# Patient Record
Sex: Female | Born: 2000 | Race: White | Hispanic: No | Marital: Single | Smoking: Never smoker
Health system: Southern US, Community
[De-identification: ages and names within clinical notes are randomized; demographics above are authoritative.]

## PROBLEM LIST (undated history)

## (undated) DIAGNOSIS — S060X0A Concussion without loss of consciousness, initial encounter: Secondary | ICD-10-CM

## (undated) DIAGNOSIS — T7840XA Allergy, unspecified, initial encounter: Secondary | ICD-10-CM

## (undated) HISTORY — DX: Concussion without loss of consciousness, initial encounter: S06.0X0A

## (undated) HISTORY — DX: Allergy, unspecified, initial encounter: T78.40XA

---
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
## 2018-04-06 DIAGNOSIS — Z23 Encounter for immunization: Secondary | ICD-10-CM | POA: Diagnosis not present

## 2018-04-21 DIAGNOSIS — D224 Melanocytic nevi of scalp and neck: Secondary | ICD-10-CM | POA: Diagnosis not present

## 2018-04-21 DIAGNOSIS — L700 Acne vulgaris: Secondary | ICD-10-CM | POA: Diagnosis not present

## 2018-08-25 ENCOUNTER — Other Ambulatory Visit: Payer: Self-pay | Admitting: Specialist

## 2018-08-25 DIAGNOSIS — G4452 New daily persistent headache (NDPH): Secondary | ICD-10-CM

## 2018-09-01 ENCOUNTER — Ambulatory Visit
Admission: RE | Admit: 2018-09-01 | Discharge: 2018-09-01 | Disposition: A | Discharge status: Home / Self Care | Payer: 59 | Source: Ambulatory Visit | Attending: Specialist | Admitting: Specialist

## 2018-09-01 ENCOUNTER — Other Ambulatory Visit: Payer: Self-pay

## 2018-09-01 DIAGNOSIS — G43019 Migraine without aura, intractable, without status migrainosus: Secondary | ICD-10-CM | POA: Diagnosis not present

## 2018-09-01 DIAGNOSIS — Z049 Encounter for examination and observation for unspecified reason: Secondary | ICD-10-CM | POA: Diagnosis not present

## 2018-09-01 DIAGNOSIS — Z79899 Other long term (current) drug therapy: Secondary | ICD-10-CM | POA: Diagnosis not present

## 2018-09-01 DIAGNOSIS — G4452 New daily persistent headache (NDPH): Secondary | ICD-10-CM

## 2018-09-01 DIAGNOSIS — R51 Headache: Secondary | ICD-10-CM | POA: Diagnosis not present

## 2018-10-24 DIAGNOSIS — R51 Headache: Secondary | ICD-10-CM | POA: Diagnosis not present

## 2018-10-24 DIAGNOSIS — G43019 Migraine without aura, intractable, without status migrainosus: Secondary | ICD-10-CM | POA: Diagnosis not present

## 2018-10-27 DIAGNOSIS — R51 Headache: Secondary | ICD-10-CM | POA: Diagnosis not present

## 2018-10-27 DIAGNOSIS — G43019 Migraine without aura, intractable, without status migrainosus: Secondary | ICD-10-CM | POA: Diagnosis not present

## 2018-11-03 DIAGNOSIS — R51 Headache: Secondary | ICD-10-CM | POA: Diagnosis not present

## 2018-11-03 DIAGNOSIS — G43019 Migraine without aura, intractable, without status migrainosus: Secondary | ICD-10-CM | POA: Diagnosis not present

## 2018-11-10 DIAGNOSIS — R51 Headache: Secondary | ICD-10-CM | POA: Diagnosis not present

## 2018-11-10 DIAGNOSIS — G43019 Migraine without aura, intractable, without status migrainosus: Secondary | ICD-10-CM | POA: Diagnosis not present

## 2018-11-18 DIAGNOSIS — G43019 Migraine without aura, intractable, without status migrainosus: Secondary | ICD-10-CM | POA: Diagnosis not present

## 2018-11-18 DIAGNOSIS — R51 Headache: Secondary | ICD-10-CM | POA: Diagnosis not present

## 2019-08-17 IMAGING — MR MR HEAD W/O CM
10 series · 48 of 48 positions shown · non-contrast
Comparison: None.

CLINICAL DATA: 17-year-old female with new daily persistent
headache the past 2 months. No known injury.

EXAM:
MRI HEAD WITHOUT CONTRAST
TECHNIQUE: Multiplanar, multiecho pulse sequences of the brain and surrounding
structures were obtained without intravenous contrast.

[Series 2: t1_se_sag · sagittal · 5.0mm · 0.45mm/px · 2 of 21 slices shown]
[im 1/21]
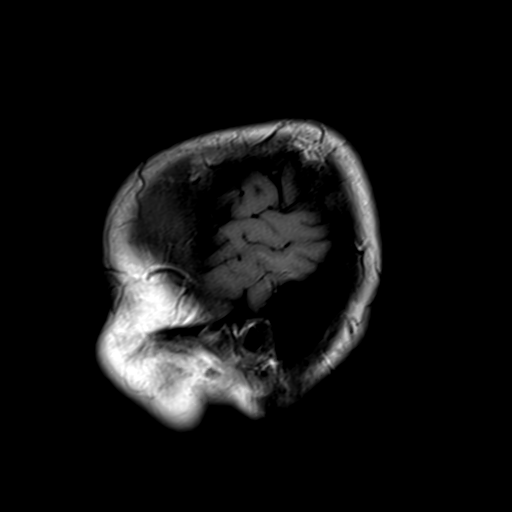
[im 21/21]
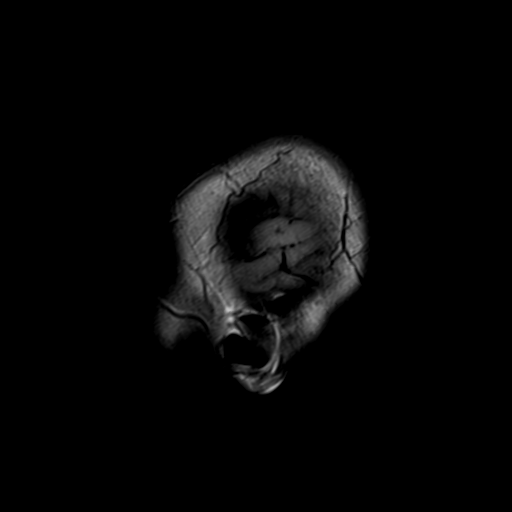

[Series 3: t2_tse_tra_512 · axial · 5.0mm · 0.60mm/px · z∈[-78,+59]mm · 2 of 24 slices shown]
[im 1/24]
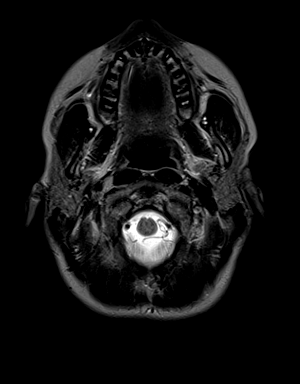
[im 24/24]
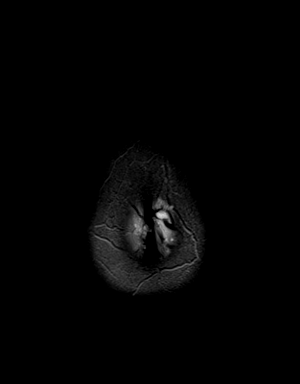

[Series 4: ep2d_diff_(id)_trace · axial · 3.0mm · 1.80mm/px · z∈[-80,+61]mm · 9 of 96 slices shown]
[im 1/96]
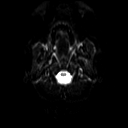
[im 12/96]
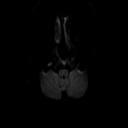
[im 24/96]
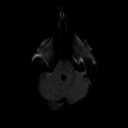
[im 36/96]
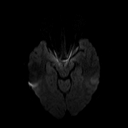
[im 48/96]
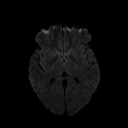
[im 60/96]
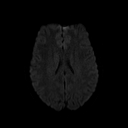
[im 72/96]
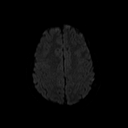
[im 84/96]
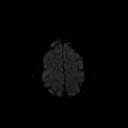
[im 96/96]
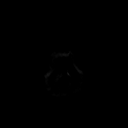

[Series 5: ep2d_diff_(id)_trace_adc · axial · 3.0mm · 1.80mm/px · z∈[-80,+61]mm · 5 of 48 slices shown]
[im 1/48]
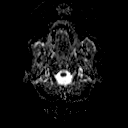
[im 12/48]
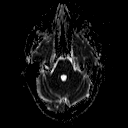
[im 24/48]
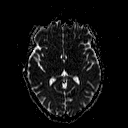
[im 36/48]
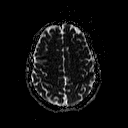
[im 48/48]
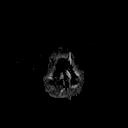

[Series 6: ep2d_diff_cor · coronal · 5.0mm · 1.77mm/px · 5 of 50 slices shown]
[im 1/50]
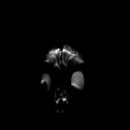
[im 13/50]
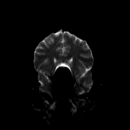
[im 25/50]
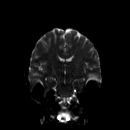
[im 37/50]
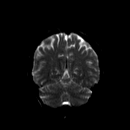
[im 50/50]
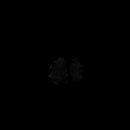

[Series 7: ep2d_diff_cor_adc · coronal · 5.0mm · 1.77mm/px · 2 of 25 slices shown]
[im 1/25]
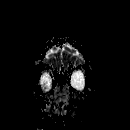
[im 25/25]
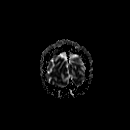

[Series 9: swi_images · axial · 4.0mm · 0.90mm/px · z∈[-83,+57]mm · 3 of 36 slices shown]
[im 1/36]
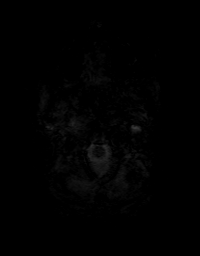
[im 18/36]
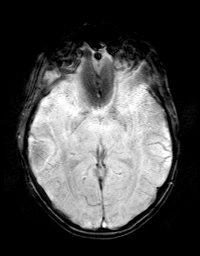
[im 36/36]
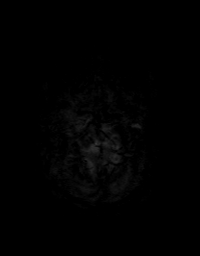

[Series 10: FLAIR · axial · 3.0mm · 0.43mm/px · z∈[-90,+66]mm · 3 of 27 slices shown]
[im 1/27]
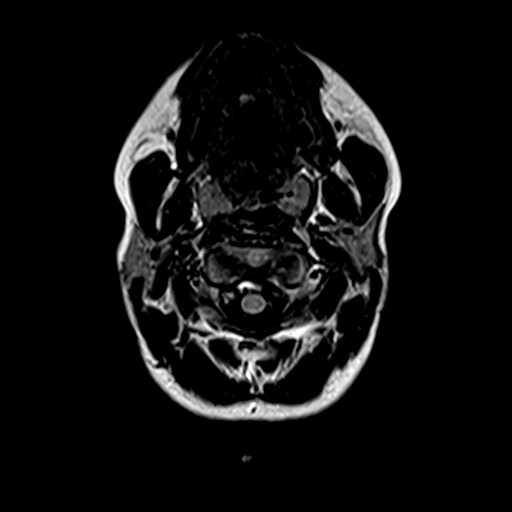
[im 14/27]
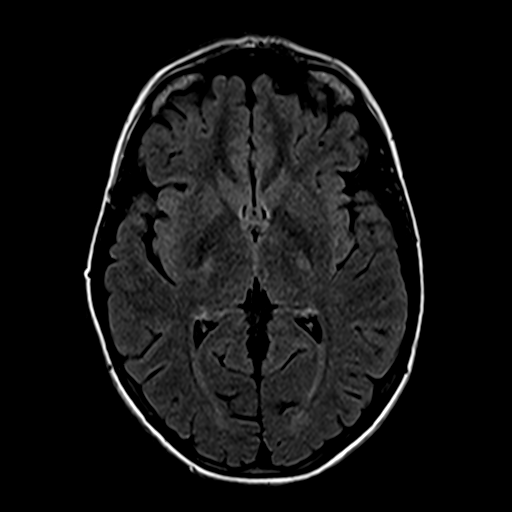
[im 27/27]
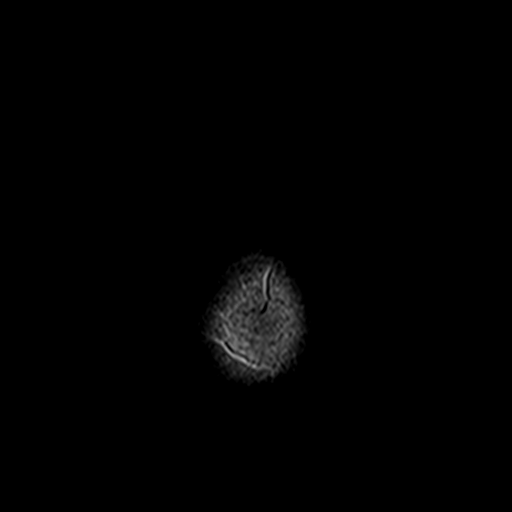

[Series 11: t1_mpr_tra · axial · 1.0mm · 0.72mm/px · z∈[-84,+59]mm · 14 of 144 slices shown]
[im 1/144]
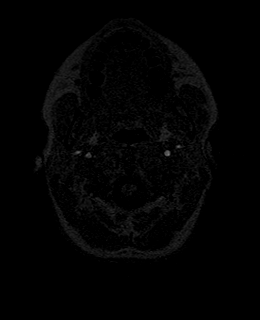
[im 12/144]
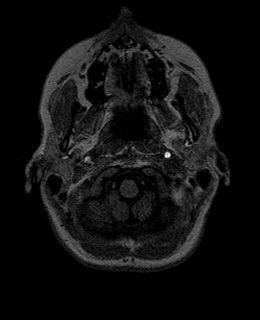
[im 23/144]
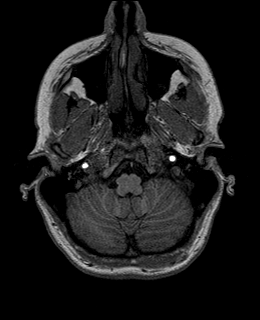
[im 34/144]
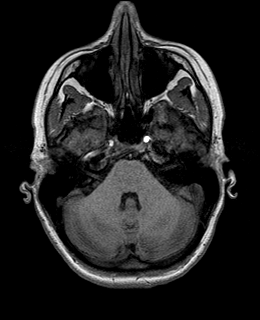
[im 45/144]
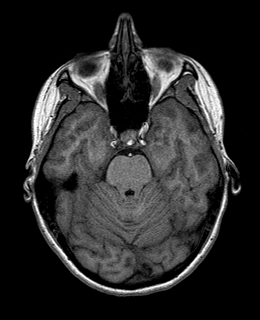
[im 56/144]
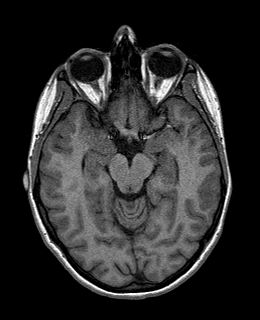
[im 67/144]
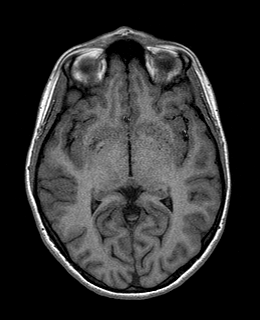
[im 78/144]
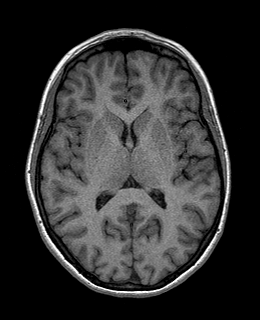
[im 89/144]
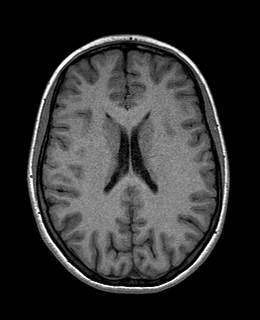
[im 100/144]
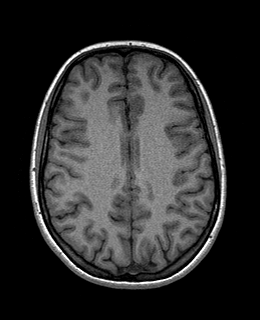
[im 111/144]
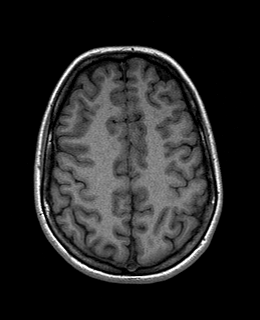
[im 122/144]
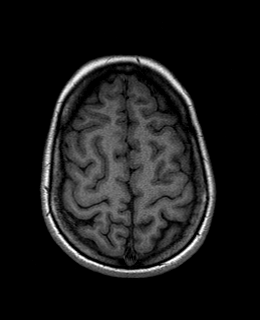
[im 133/144]
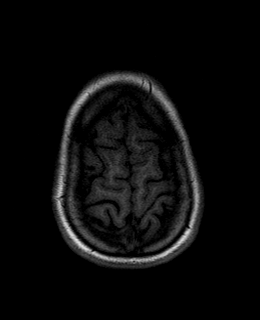
[im 144/144]
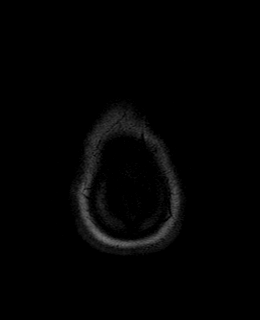

[Series 12: T2 · coronal · 5.0mm · 0.45mm/px · 3 of 26 slices shown]
[im 1/26]
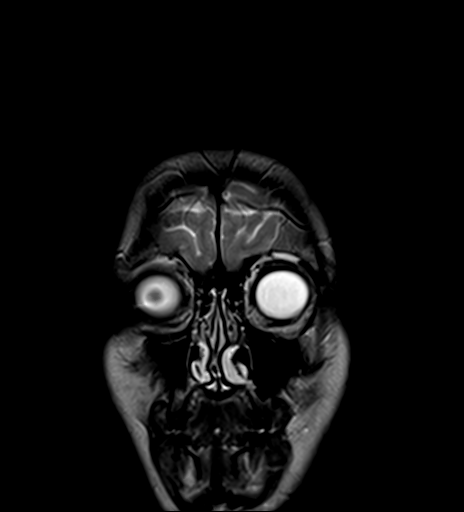
[im 13/26]
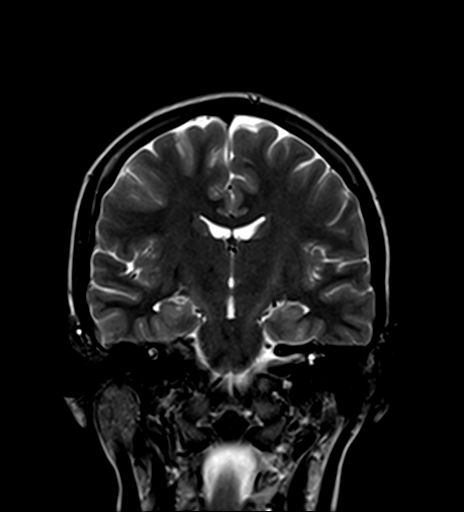
[im 26/26]
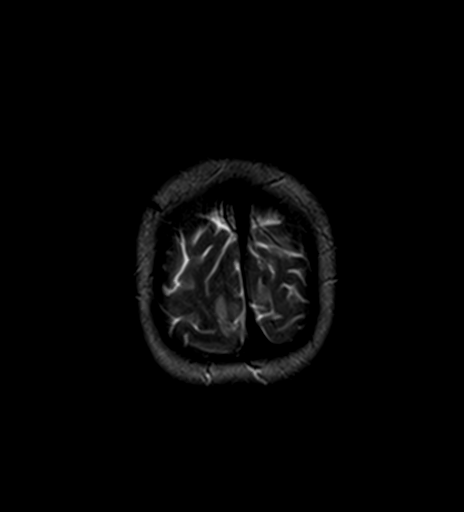

[48 of 48 positions shown; findings below may reference images not displayed]

FINDINGS: Brain: No restricted diffusion to suggest acute infarction. No
midline shift, mass effect, evidence of mass lesion,
ventriculomegaly, extra-axial collection or acute intracranial
hemorrhage. Cervicomedullary junction and pituitary are within
normal limits. Normal cerebral volume.

Gray and white matter signal is within normal limits throughout the
brain. No chronic cerebral blood products or encephalomalacia.

Vascular: Major intracranial vascular flow voids are preserved and
appear normal.

Skull and upper cervical spine: Negative visible cervical spine.
Visualized bone marrow signal is within normal limits.

Sinuses/Orbits: Normal orbits soft tissues. Paranasal sinuses are
clear.

Other: Mastoid air cells are clear. Visible internal auditory
structures appear normal. Scalp and face soft tissues appear normal
for age.
IMPRESSION: Normal MRI appearance of the brain.
